# Patient Record
Sex: Male | Born: 1975 | Race: White | Hispanic: No | Marital: Married | Smoking: Never smoker
Health system: Southern US, Community
[De-identification: ages and names within clinical notes are randomized; demographics above are authoritative.]

## PROBLEM LIST (undated history)

## (undated) DIAGNOSIS — S42002A Fracture of unspecified part of left clavicle, initial encounter for closed fracture: Secondary | ICD-10-CM

## (undated) DIAGNOSIS — K219 Gastro-esophageal reflux disease without esophagitis: Secondary | ICD-10-CM

## (undated) HISTORY — PX: VASECTOMY: SHX75

## (undated) HISTORY — PX: COLONOSCOPY: SHX174

---

## 1998-06-07 ENCOUNTER — Emergency Department (HOSPITAL_COMMUNITY): Admission: EM | Admit: 1998-06-07 | Discharge: 1998-06-07 | Payer: Self-pay | Admitting: Emergency Medicine

## 1998-06-08 ENCOUNTER — Encounter: Payer: Self-pay | Admitting: Emergency Medicine

## 1999-06-29 ENCOUNTER — Ambulatory Visit (HOSPITAL_COMMUNITY): Admission: RE | Admit: 1999-06-29 | Discharge: 1999-06-29 | Payer: Self-pay | Admitting: Family Medicine

## 1999-06-29 ENCOUNTER — Encounter: Payer: Self-pay | Admitting: Family Medicine

## 1999-09-02 ENCOUNTER — Ambulatory Visit (HOSPITAL_COMMUNITY): Admission: RE | Admit: 1999-09-02 | Discharge: 1999-09-02 | Payer: Self-pay | Admitting: Family Medicine

## 1999-09-02 ENCOUNTER — Encounter: Payer: Self-pay | Admitting: Family Medicine

## 2011-08-10 ENCOUNTER — Encounter (HOSPITAL_BASED_OUTPATIENT_CLINIC_OR_DEPARTMENT_OTHER): Payer: Self-pay | Admitting: Student

## 2011-08-10 ENCOUNTER — Emergency Department (HOSPITAL_BASED_OUTPATIENT_CLINIC_OR_DEPARTMENT_OTHER)
Admission: EM | Admit: 2011-08-10 | Discharge: 2011-08-10 | Disposition: A | Discharge status: Home / Self Care | Payer: Federal, State, Local not specified - PPO | Attending: Emergency Medicine | Admitting: Emergency Medicine

## 2011-08-10 DIAGNOSIS — E860 Dehydration: Secondary | ICD-10-CM | POA: Insufficient documentation

## 2011-08-10 LAB — COMPREHENSIVE METABOLIC PANEL
ALT: 22 U/L (ref 0–53)
AST: 29 U/L (ref 0–37)
Albumin: 4.3 g/dL (ref 3.5–5.2)
Calcium: 9.4 mg/dL (ref 8.4–10.5)
Creatinine, Ser: 1.1 mg/dL (ref 0.50–1.35)
GFR calc non Af Amer: 86 mL/min — ABNORMAL LOW (ref >90)
Sodium: 136 mEq/L (ref 135–145)
Total Protein: 7.1 g/dL (ref 6.0–8.3)

## 2011-08-10 LAB — URINALYSIS, ROUTINE W REFLEX MICROSCOPIC
Bilirubin Urine: NEGATIVE
Glucose, UA: NEGATIVE mg/dL
Hgb urine dipstick: NEGATIVE
Ketones, ur: NEGATIVE mg/dL
Leukocytes, UA: NEGATIVE
Nitrite: NEGATIVE
Protein, ur: NEGATIVE mg/dL
Specific Gravity, Urine: 1.018 (ref 1.005–1.030)
Urobilinogen, UA: 0.2 mg/dL (ref 0.0–1.0)
pH: 7.5 (ref 5.0–8.0)

## 2011-08-10 LAB — CBC
HCT: 38.9 % — ABNORMAL LOW (ref 39.0–52.0)
MCHC: 35.5 g/dL (ref 30.0–36.0)
MCV: 85.1 fL (ref 78.0–100.0)
Platelets: 203 10*3/uL (ref 150–400)
RDW: 12.3 % (ref 11.5–15.5)
WBC: 6.5 10*3/uL (ref 4.0–10.5)

## 2011-08-10 LAB — DIFFERENTIAL
Basophils Absolute: 0 10*3/uL (ref 0.0–0.1)
Basophils Relative: 0 % (ref 0–1)
Eosinophils Absolute: 0.1 10*3/uL (ref 0.0–0.7)
Eosinophils Relative: 1 % (ref 0–5)
Monocytes Absolute: 0.5 10*3/uL (ref 0.1–1.0)

## 2011-08-10 MED ORDER — ONDANSETRON HCL 4 MG/2ML IJ SOLN
4.0000 mg | Freq: Once | INTRAMUSCULAR | Status: AC
  Administered 2011-08-10: 4 mg via INTRAVENOUS
  Filled 2011-08-10: qty 2

## 2011-08-10 MED ORDER — SODIUM CHLORIDE 0.9 % IV SOLN
Freq: Once | INTRAVENOUS | Status: AC
  Administered 2011-08-10: 15:00:00 via INTRAVENOUS

## 2011-08-10 NOTE — ED Notes (Signed)
MD at bedside. 

## 2011-08-10 NOTE — ED Provider Notes (Signed)
Medical screening examination/treatment/procedure(s) were performed by non-physician practitioner and as supervising physician I was immediately available for consultation/collaboration.   Lasundra Hascall A Chava Dulac, MD 08/10/11 2314 

## 2011-08-10 NOTE — ED Notes (Signed)
Pt in with c/o full body numbess and tingling and sore/swollen throat.

## 2011-08-10 NOTE — ED Provider Notes (Signed)
History     CSN: [id number]  Arrival date & time 08/10/11  1309   First MD Initiated Contact with Patient 08/10/11 1335      Chief Complaint  Patient presents with  . Sore Throat  . Numbness  . Tingling    (Consider location/radiation/quality/duration/timing/severity/associated sxs/prior treatment) Patient is a 36 y.o. male presenting with weakness. The history is provided by the patient. No language interpreter was used.  Weakness The primary symptoms include paresthesias and nausea. The symptoms began less than 1 hour ago. The symptoms are worsening.  Additional symptoms include weakness.   patient describes an episode of full-body numbness and tingling patient reports his throat felt sore and tight patient complains that he felt short of breath. 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 Patient reports he has been drinking plenty of fluids. He reports he is used to the heat in the exertion patient reports he normally drinks Gatorade but he did not drink the Gatorade today. Patient reports he does not have any medical problems he has never had anything happen like this in the past. He reports throat tightness shortness of breath and numbness have resolved. Patient reports still feeling a little tingling in his upper extremities at the time of my initial history. Patient denies any nausea vomiting or diarrhea. Patient has not had a fever he denies any cough he denies any visual changes hearing changes he denies any weakness in any extremities  History reviewed. No pertinent past medical history.  History reviewed. No pertinent past surgical history.  History reviewed. No pertinent family history.  History  Substance Use Topics  . Smoking status: Never Smoker   . Smokeless tobacco: Not on file  . Alcohol Use: No      Review of Systems  Gastrointestinal: Positive for nausea.  Neurological: Positive for weakness and  paresthesias.  All other systems reviewed and are negative.    Allergies  Review of patient's allergies indicates no known allergies.  Home Medications  No current outpatient prescriptions on file.  BP 131/75  Pulse 65  Temp 97.3 F (36.3 C) (Oral)  Resp 22  Wt 206 lb (93.441 kg)  SpO2 100%  Physical Exam  Nursing note and vitals reviewed. Constitutional: He is oriented to person, place, and time. He appears well-developed and well-nourished.  HENT:  Head: Normocephalic and atraumatic.  Right Ear: External ear normal.  Left Ear: External ear normal.  Nose: Nose normal.  Mouth/Throat: Oropharynx is clear and moist.  Eyes: Conjunctivae are normal. Pupils are equal, round, and reactive to light.  Neck: Normal range of motion. Neck supple.  Cardiovascular: Normal rate, regular rhythm and normal heart sounds.   Pulmonary/Chest: Effort normal and breath sounds normal.  Abdominal: Soft. Bowel sounds are normal.  Musculoskeletal: Normal range of motion.  Neurological: He is alert and oriented to person, place, and time. He has normal reflexes.  Skin: Skin is warm.  Psychiatric: He has a normal mood and affect.    ED Course  Procedures (including critical care time)  Labs Reviewed - No data to display No results found.   1. Dehydration    Results for orders placed during the hospital encounter of 08/10/11  URINALYSIS, ROUTINE W REFLEX MICROSCOPIC      Component Value Range   Color, Urine YELLOW  YELLOW   APPearance CLEAR  CLEAR   Specific Gravity, Urine 1.018  1.005 - 1.030  pH 7.5  5.0 - 8.0   Glucose, UA NEGATIVE  NEGATIVE mg/dL   Hgb urine dipstick NEGATIVE  NEGATIVE   Bilirubin Urine NEGATIVE  NEGATIVE   Ketones, ur NEGATIVE  NEGATIVE mg/dL   Protein, ur NEGATIVE  NEGATIVE mg/dL   Urobilinogen, UA 0.2  0.0 - 1.0 mg/dL   Nitrite NEGATIVE  NEGATIVE   Leukocytes, UA NEGATIVE  NEGATIVE  CBC      Component Value Range   WBC 6.5  4.0 - 10.5 K/uL   RBC 4.57   4.22 - 5.81 MIL/uL   Hemoglobin 13.8  13.0 - 17.0 g/dL   HCT 16.1 (*) 09.6 - 04.5 %   MCV 85.1  78.0 - 100.0 fL   MCH 30.2  26.0 - 34.0 pg   MCHC 35.5  30.0 - 36.0 g/dL   RDW 40.9  81.1 - 91.4 %   Platelets 203  150 - 400 K/uL  DIFFERENTIAL      Component Value Range   Neutrophils Relative 64  43 - 77 %   Neutro Abs 4.2  1.7 - 7.7 K/uL   Lymphocytes Relative 26  12 - 46 %   Lymphs Abs 1.7  0.7 - 4.0 K/uL   Monocytes Relative 8  3 - 12 %   Monocytes Absolute 0.5  0.1 - 1.0 K/uL   Eosinophils Relative 1  0 - 5 %   Eosinophils Absolute 0.1  0.0 - 0.7 K/uL   Basophils Relative 0  0 - 1 %   Basophils Absolute 0.0  0.0 - 0.1 K/uL  COMPREHENSIVE METABOLIC PANEL      Component Value Range   Sodium 136  135 - 145 mEq/L   Potassium 3.6  3.5 - 5.1 mEq/L   Chloride 101  96 - 112 mEq/L   CO2 24  19 - 32 mEq/L   Glucose, Bld 172 (*) 70 - 99 mg/dL   BUN 25 (*) 6 - 23 mg/dL   Creatinine, Ser 7.82  0.50 - 1.35 mg/dL   Calcium 9.4  8.4 - 95.6 mg/dL   Total Protein 7.1  6.0 - 8.3 g/dL   Albumin 4.3  3.5 - 5.2 g/dL   AST 29  0 - 37 U/L   ALT 22  0 - 53 U/L   Alkaline Phosphatase 59  39 - 117 U/L   Total Bilirubin 0.4  0.3 - 1.2 mg/dL   GFR calc non Af Amer 86 (*) >90 mL/min   GFR calc Af Amer >90  >90 mL/min   No results found.  Date: 08/10/2011  Rate: 70  Rhythm: normal sinus rhythm  QRS Axis: normal  Intervals: normal  ST/T Wave abnormalities: normal  Conduction Disutrbances:none  Narrative Interpretation:   Old EKG Reviewed: none available    MDM  Pt's bun is slightly elevated.   Pt given 2 liters IV fluids,   I advised continue oral fluids.   Pt advised rest, I doubt tia, stroke,   Symptoms do not sound neurologic,   EKg is normal.   I advised pt to return if any further symptoms.          Lonia Skinner [place], [place] 08/10/11 1954

## 2011-08-10 NOTE — ED Notes (Signed)
Iv infusing without difficulty spouse at the bedside

## 2011-08-10 NOTE — Discharge Instructions (Signed)
Dehydration, Adult Dehydration is when you lose more fluids from the body than you take in. Vital organs like the kidneys, brain, and heart cannot function without a proper amount of fluids and salt. Any loss of fluids from the body can cause dehydration.  CAUSES   Vomiting.   Diarrhea.   Excessive sweating.   Excessive urine output.   Fever.  SYMPTOMS  Mild dehydration  Thirst.   Dry lips.   Slightly dry mouth.  Moderate dehydration  Very dry mouth.   Sunken eyes.   Skin does not bounce back quickly when lightly pinched and released.   Dark urine and decreased urine production.   Decreased tear production.   Headache.  Severe dehydration  Very dry mouth.   Extreme thirst.   Rapid, weak pulse (more than 100 beats per minute at rest).   Cold hands and feet.   Not able to sweat in spite of heat and temperature.   Rapid breathing.   Blue lips.   Confusion and lethargy.   Difficulty being awakened.   Minimal urine production.   No tears.  DIAGNOSIS  Your caregiver will diagnose dehydration based on your symptoms and your exam. Blood and urine tests will help confirm the diagnosis. The diagnostic evaluation should also identify the cause of dehydration. TREATMENT  Treatment of mild or moderate dehydration can often be done at home by increasing the amount of fluids that you drink. It is best to drink small amounts of fluid more often. Drinking too much at one time can make vomiting worse. Refer to the home care instructions below. Severe dehydration needs to be treated at the hospital where you will probably be given intravenous (IV) fluids that contain water and electrolytes. HOME CARE INSTRUCTIONS   Ask your caregiver about specific rehydration instructions.   Drink enough fluids to keep your urine clear or pale yellow.   Drink small amounts frequently if you have nausea and vomiting.   Eat as you normally do.   Avoid:   Foods or drinks high in  sugar.   Carbonated drinks.   Juice.   Extremely hot or cold fluids.   Drinks with caffeine.   Fatty, greasy foods.   Alcohol.   Tobacco.   Overeating.   Gelatin desserts.   Wash your hands well to avoid spreading bacteria and viruses.   Only take over-the-counter or prescription medicines for pain, discomfort, or fever as directed by your caregiver.   Ask your caregiver if you should continue all prescribed and over-the-counter medicines.   Keep all follow-up appointments with your caregiver.  SEEK MEDICAL CARE IF:  You have abdominal pain and it increases or stays in one area (localizes).   You have a rash, stiff neck, or severe headache.   You are irritable, sleepy, or difficult to awaken.   You are weak, dizzy, or extremely thirsty.  SEEK IMMEDIATE MEDICAL CARE IF:   You are unable to keep fluids down or you get worse despite treatment.   You have frequent episodes of vomiting or diarrhea.   You have blood or green matter (bile) in your vomit.   You have blood in your stool or your stool looks black and tarry.   You have not urinated in 6 to 8 hours, or you have only urinated a small amount of very dark urine.   You have a fever.   You faint.  MAKE SURE YOU:   Understand these instructions.   Will watch your condition.     Will get help right away if you are not doing well or get worse.  Document Released: 02/13/2005 Document Revised: 02/02/2011 Document Reviewed: 10/03/2010 [employer] Patient Information 2012 [employer]. 

## 2011-08-12 ENCOUNTER — Emergency Department (HOSPITAL_COMMUNITY)
Admission: EM | Admit: 2011-08-12 | Discharge: 2011-08-12 | Disposition: A | Discharge status: Home / Self Care | Payer: Federal, State, Local not specified - PPO | Attending: Emergency Medicine | Admitting: Emergency Medicine

## 2011-08-12 ENCOUNTER — Encounter (HOSPITAL_COMMUNITY): Payer: Self-pay | Admitting: Emergency Medicine

## 2011-08-12 DIAGNOSIS — R202 Paresthesia of skin: Secondary | ICD-10-CM

## 2011-08-12 DIAGNOSIS — R209 Unspecified disturbances of skin sensation: Secondary | ICD-10-CM | POA: Insufficient documentation

## 2011-08-12 LAB — BASIC METABOLIC PANEL
CO2: 29 mEq/L (ref 19–32)
Chloride: 102 mEq/L (ref 96–112)
GFR calc non Af Amer: 68 mL/min — ABNORMAL LOW (ref >90)
Glucose, Bld: 96 mg/dL (ref 70–99)
Potassium: 4.2 mEq/L (ref 3.5–5.1)
Sodium: 139 mEq/L (ref 135–145)

## 2011-08-12 NOTE — ED Provider Notes (Signed)
History     CSN: [id number]  Arrival date & time 08/12/11  1628   First MD Initiated Contact with Patient 08/12/11 1945      Chief Complaint  Patient presents with  . Tingling    (Consider location/radiation/quality/duration/timing/severity/associated sxs/prior treatment) HPI  36 year old male past medical history of some right scapular musculoskeletal pain treated with Mobic and Flexeril recently, chief complaint 48 hour history of bilateral lower posterior leg and bilateral upper extremity hands>arms tingling sensation.  He has also intermittently seeing spots in his eyes. He felt lightheaded when all this started. He had trouble breathing when all this started for 10 minutes but that resolved. [facility], where he was diagnosed with dehydration and given 2 L of saline. He has felt poorly and fatigued since that day. He denies any weakness numbness nausea vomiting diarrhea or recent travel tick bite and all exposure chest pain cough changes in bowel or bladder habits.  On arrival his vital signs were normal  History reviewed. No pertinent past medical history.  History reviewed. No pertinent past surgical history.  History reviewed. No pertinent family history.  History  Substance Use Topics  . Smoking status: Never Smoker   . Smokeless tobacco: Not on file  . Alcohol Use: No      Review of Systems Constitutional: Negative for fever and chills.  HENT: Negative for ear pain, sore throat and trouble swallowing.   Eyes: Negative for pain and visual disturbance.  Respiratory: Negative for cough and shortness of breath.   Cardiovascular: Negative for chest pain and leg swelling.  Gastrointestinal: Negative for nausea, vomiting, abdominal pain and diarrhea.  Genitourinary: Negative for dysuria, urgency and frequency.  Musculoskeletal: Negative for back pain and joint swelling. Positive very mild right back pain located over the scapula.  Skin: Negative for  rash and wound.  Neurological: Negative for dizziness, syncope, speech difficulty, weakness and POS numbness.   Allergies  Review of patient's allergies indicates no known allergies.  Home Medications   Current Outpatient Rx  Name Route Sig Dispense Refill  . CYCLOBENZAPRINE HCL 5 MG PO TABS Oral Take 5 mg by mouth at bedtime as needed.    . MELOXICAM 15 MG PO TABS Oral Take 15 mg by mouth daily with breakfast.      BP 118/64  Pulse 65  Temp 98.4 F (36.9 C) (Oral)  Resp 18  SpO2 97%  Physical Exam Consitutional: Pt in no acute distress.   Head: Normocephalic and atraumatic.  Eyes: Extraocular motion intact, no scleral icterus Neck: Supple without meningismus, mass, or overt JVD Respiratory: Effort normal and breath sounds normal. No respiratory distress. CV: Heart regular rate and rhythm, no obvious murmurs.  Pulses +2 and symmetric Abdomen: Soft, non-tender, non-distended MSK: Extremities are atraumatic without deformity, ROM intact. Entire length of spine nontender to coarse percussion Skin: Warm, dry, intact Neuro: Alert and oriented, no motor deficit noted.  CNs intact.  PERRL.  Reflexes normal BLE, no clonus.  Hips, knee and ankle, arm and wrist strength maintained.  FTN and RAM normal.  CNs 2-12 normal.  Psychiatric: Mood and affect are normal    ED Course  Procedures (including critical care time)  Labs Reviewed  BASIC METABOLIC PANEL - Abnormal; Notable for the following:    GFR calc non Af Amer 68 (*)     GFR calc Af Amer 79 (*)     All other components within normal limits   No results found.  1. Paresthesia of right arm and leg   2. Paresthesia of left arm and leg       MDM  Unclear etiology. Patient with normal vital signs. Patient looks healthy. Negative neurological exam. We'll check electrolytes for calcium dysregulation. We'll do orthostatic vital signs.    Orthostatics negative.  No evidence of calcium dysregulation. Discharged home to  followup with neurologist if not better in 3 days.  PT DC home stable.  Discussed with pt the clinical impression, treatment in the ED, and follow up plan.  We alslo discussed the indications for returning to the ED, which include shortness or breath, confusion, fever, new weakness or numbness, chest pain, or any other concerning symptom.  The pt understood the treatment and plan, is stable, and is able to leave the ED.           Larrie Kass, MD 08/12/11 2317

## 2011-08-12 NOTE — ED Notes (Signed)
Patient states that he has had tingling all over off and on for the past couple of days.  Was seen at [facility] and given 2 liters of fluids and stated that he started feeling better.   This AM the tingling was constant, he called off both jobs and laid around at home. He states he has not gotten better and decided to come to be seen.  Denies headache, grips are equal.

## 2011-08-12 NOTE — ED Notes (Signed)
Pt reports that he has been having neck stiffness and tingling in bil arms, reports pulled muscle in mid back 5/18, and week after that is when started to have tingling, has gotten progressively worse; pt was seen 6/13- was told he was dehydrated and received fluid- reports, that tingling felt better, but has returned again; pt reports all symptoms tingling in extremities, face tingling, neck stiff, general malaise, stiffness with movemnet, vision darkening, fatigue; grips equal; face symmetrical, no arm drift

## 2011-08-12 NOTE — Discharge Instructions (Signed)
Follow up with your providers as dicussed in the ED today and as written above.  See your doctor immediately--or return to the ED--with any new or troubling symptoms including fevers, weakness, new chest pain, shortness or breath, numbness, or any other concerning symptom.    Paresthesia    Paresthesia is an abnormal burning or prickling sensation. This sensation is generally felt in the hands, arms, legs, or feet. However, it may occur in any part of the body. It is usually not painful. The feeling may be described as:  Tingling or numbness.   "Pins and needles."   Skin crawling.   Buzzing.   Limbs "falling asleep."   Itching.  Most people experience temporary (transient) paresthesia at some time in their lives. CAUSES  Paresthesia may occur when you breathe too quickly (hyperventilation). It can also occur without any apparent cause. Commonly, paresthesia occurs when pressure is placed on a nerve. The feeling quickly goes away once the pressure is removed. For some people, however, paresthesia is a long-lasting (chronic) condition caused by an underlying disorder. The underlying disorder may be:  A traumatic, direct injury to nerves. Examples include a:   Broken (fractured) neck.   Fractured skull.   A disorder affecting the brain and spinal cord (central nervous system). Examples include:   Transverse myelitis.   Encephalitis.   Transient ischemic attack.   Multiple sclerosis.   Stroke.   Tumor or blood vessel problems, such as an arteriovenous malformation pressing against the brain or spinal cord.   A condition that damages the peripheral nerves (peripheral neuropathy). Peripheral nerves are not part of the brain and spinal cord. These conditions include:   Diabetes.   Peripheral vascular disease.   Nerve entrapment syndromes, such as carpal tunnel syndrome.   Shingles.   Hypothyroidism.   Vitamin B12 deficiencies.   Alcoholism.   Heavy metal poisoning  (lead, arsenic).   Rheumatoid arthritis.   Systemic lupus erythematosus.  DIAGNOSIS  Your caregiver will attempt to find the underlying cause of your paresthesia. Your caregiver may:  Take your medical history.   Perform a physical exam.   Order various lab tests.   Order imaging tests.  TREATMENT  Treatment for paresthesia depends on the underlying cause. HOME CARE INSTRUCTIONS  Avoid drinking alcohol.   You may consider massage or acupuncture to help relieve your symptoms.   Keep all follow-up appointments as directed by your caregiver.  SEEK IMMEDIATE MEDICAL CARE IF:   You feel weak.   You have trouble walking or moving.   You have problems with speech or vision.   You feel confused.   You cannot control your bladder or bowel movements.   You feel numbness after an injury.   You faint.   Your burning or prickling feeling gets worse when walking.   You have pain, cramps, or dizziness.   You develop a rash.  MAKE SURE YOU:  Understand these instructions.   Will watch your condition.   Will get help right away if you are not doing well or get worse.  Document Released: 02/03/2002 Document Revised: 02/02/2011 Document Reviewed: 11/04/2010 [facility] Patient Information 2012 [place], [place].

## 2011-08-13 NOTE — ED Provider Notes (Signed)
  I performed a history and physical examination of Alexander Burgess and discussed his management with Dr. Rainey Pines.  I agree with the history, physical, assessment, and plan of care, with the following exceptions: None Generally well-appearing young male now subjective dysesthesia, neurologically intact.  Given the patient's description of appropriate and lower extremity dysesthesias or some suspicion for inflammatory condition.  The patient was discharged in stable condition after discussion on the need for continued management via his primary care physician and an unremarkable evaluation here  Eladio Dentremont, Elvis Coil, MD 08/13/11 0008

## 2011-08-21 ENCOUNTER — Encounter (HOSPITAL_BASED_OUTPATIENT_CLINIC_OR_DEPARTMENT_OTHER): Payer: Self-pay | Admitting: *Deleted

## 2011-08-21 ENCOUNTER — Emergency Department (HOSPITAL_BASED_OUTPATIENT_CLINIC_OR_DEPARTMENT_OTHER)
Admission: EM | Admit: 2011-08-21 | Discharge: 2011-08-21 | Disposition: A | Discharge status: Home / Self Care | Payer: Federal, State, Local not specified - PPO | Attending: Emergency Medicine | Admitting: Emergency Medicine

## 2011-08-21 ENCOUNTER — Emergency Department (HOSPITAL_BASED_OUTPATIENT_CLINIC_OR_DEPARTMENT_OTHER): Payer: Federal, State, Local not specified - PPO

## 2011-08-21 DIAGNOSIS — Y93G1 Activity, food preparation and clean up: Secondary | ICD-10-CM | POA: Insufficient documentation

## 2011-08-21 DIAGNOSIS — W260XXA Contact with knife, initial encounter: Secondary | ICD-10-CM | POA: Insufficient documentation

## 2011-08-21 DIAGNOSIS — W261XXA Contact with sword or dagger, initial encounter: Secondary | ICD-10-CM | POA: Insufficient documentation

## 2011-08-21 DIAGNOSIS — Z23 Encounter for immunization: Secondary | ICD-10-CM | POA: Insufficient documentation

## 2011-08-21 DIAGNOSIS — S61218A Laceration without foreign body of other finger without damage to nail, initial encounter: Secondary | ICD-10-CM

## 2011-08-21 DIAGNOSIS — S61209A Unspecified open wound of unspecified finger without damage to nail, initial encounter: Secondary | ICD-10-CM | POA: Insufficient documentation

## 2011-08-21 MED ORDER — TETANUS-DIPHTH-ACELL PERTUSSIS 5-2.5-18.5 LF-MCG/0.5 IM SUSP
0.5000 mL | Freq: Once | INTRAMUSCULAR | Status: AC
  Administered 2011-08-21: 0.5 mL via INTRAMUSCULAR
  Filled 2011-08-21: qty 0.5

## 2011-08-21 NOTE — ED Provider Notes (Signed)
History     CSN: [id number]  Arrival date & time 08/21/11  2104   First MD Initiated Contact with Patient 08/21/11 2256      Chief Complaint  Patient presents with  . Extremity Laceration    (Consider location/radiation/quality/duration/timing/severity/associated sxs/prior treatment) The history is provided by the patient.   patient cut the tip of his left index finger with a knife while cutting onions. No other injury. His been unable to stop the bleeding. No numbness or weakness. Last tetanus was greater than 10 years ago. He works as a Advertising account planner.  History reviewed. No pertinent past medical history.  History reviewed. No pertinent past surgical history.  History reviewed. No pertinent family history.  History  Substance Use Topics  . Smoking status: Never Smoker   . Smokeless tobacco: Not on file  . Alcohol Use: No     social      Review of Systems  Musculoskeletal: Negative for joint swelling.  Skin: Positive for wound.  Neurological: Negative for weakness and numbness.    Allergies  Review of patient's allergies indicates no known allergies.  Home Medications   Current Outpatient Rx  Name Route Sig Dispense Refill  . CYCLOBENZAPRINE HCL 5 MG PO TABS Oral Take 5 mg by mouth at bedtime as needed.    . MELOXICAM 15 MG PO TABS Oral Take 15 mg by mouth daily with breakfast.      BP 140/64  Pulse 69  Temp 97.9 F (36.6 C) (Oral)  Resp 19  SpO2 99%  Physical Exam  Constitutional: He appears well-developed.  Musculoskeletal: Normal range of motion.       Approximately 1 cm oval-shaped amputation of tissue on the pad of the left index finger. Flexion extension is intact at MCP PIP and DIP joints. Mild bleeding. No pulsatile bleeding. Nailbed is uninvolved.  Neurological: He is alert.    ED Course  Procedures (including critical care time)  Labs Reviewed - No data to display Dg Finger Index Left  08/21/2011  *RADIOLOGY REPORT*  Clinical Data: Laceration   LEFT INDEX FINGER 2+V  Comparison: None.  Findings: Soft tissue injury noted distally.  No radiopaque foreign body.  No underlying fracture or acute osseous finding.  No malalignment.  IMPRESSION: Soft tissue injury.  No acute osseous finding  Original Report Authenticated By: Judie Petit. TREVOR Miles Costain, M.D.     1. Laceration of finger, index       MDM  Laceration to finger with some amputation of tissue. Tetanus is updated. X-ray shows no fracture. Is not a suturable laceration. Patient was given nonocclusive dressings and will followup as needed.        Juliet Rude. Rubin Payor, MD 08/21/11 2314

## 2011-08-21 NOTE — ED Notes (Signed)
MD at bedside. 

## 2011-08-21 NOTE — ED Notes (Signed)
Pt was cutting onions with a knife and cut his left index finger.  Pt was unable to stop bleeding.

## 2011-08-21 NOTE — Discharge Instructions (Signed)
Finger Avulsion  When the tip of the finger is lost, a new nail may grow back if part of the fingernail is left. The new nail may be deformed. If just the tip of the finger is lost, no repair may be needed unless there is bone showing. If bone is showing, your caregiver may need to remove the protruding bone and put on a bandage. Your caregiver will do what is best for you. Most of the time when a fingertip is lost, the end will gradually grow back on and look fairly normal, but it may remain sensitive to pressure and temperature extremes for a long time. HOME CARE INSTRUCTIONS   Keep your hand elevated above your heart to relieve pain and swelling.   Keep your dressing dry and clean.   Change your bandage in 24 hours or as directed.   After your bandage is changed, soak your hand in warm soapy water for 10 to 15 minutes. Do this 3 times per day. This helps reduce pain and swelling.   After soaking your hand, apply a clean, dry bandage. Change your bandage if it is wet or dirty.   Only take over-the-counter or prescription medicines for pain, discomfort, or fever as directed by your caregiver.   See your caregiver as needed for problems.  SEEK MEDICAL CARE IF:   You have increased pain, swelling, drainage, or bleeding.   You have a fever.   You have swelling that spreads from your finger and into your hand.  Make sure to check to see if you need a tetanus booster. Document Released: 04/24/2001 Document Revised: 02/02/2011 Document Reviewed: 03/19/2008 [facility] Patient Information 2012 [place], [place].

## 2016-04-16 ENCOUNTER — Emergency Department (HOSPITAL_COMMUNITY): Payer: Federal, State, Local not specified - PPO

## 2016-04-16 ENCOUNTER — Encounter (HOSPITAL_COMMUNITY): Payer: Self-pay | Admitting: *Deleted

## 2016-04-16 ENCOUNTER — Emergency Department (HOSPITAL_COMMUNITY)
Admission: EM | Admit: 2016-04-16 | Discharge: 2016-04-16 | Disposition: A | Discharge status: Home / Self Care | Payer: Federal, State, Local not specified - PPO | Attending: Emergency Medicine | Admitting: Emergency Medicine

## 2016-04-16 DIAGNOSIS — Y92320 Baseball field as the place of occurrence of the external cause: Secondary | ICD-10-CM | POA: Diagnosis not present

## 2016-04-16 DIAGNOSIS — Y9364 Activity, baseball: Secondary | ICD-10-CM | POA: Insufficient documentation

## 2016-04-16 DIAGNOSIS — S4992XA Unspecified injury of left shoulder and upper arm, initial encounter: Secondary | ICD-10-CM | POA: Diagnosis present

## 2016-04-16 DIAGNOSIS — Z79899 Other long term (current) drug therapy: Secondary | ICD-10-CM | POA: Insufficient documentation

## 2016-04-16 DIAGNOSIS — S42002A Fracture of unspecified part of left clavicle, initial encounter for closed fracture: Secondary | ICD-10-CM | POA: Diagnosis not present

## 2016-04-16 DIAGNOSIS — W010XXA Fall on same level from slipping, tripping and stumbling without subsequent striking against object, initial encounter: Secondary | ICD-10-CM | POA: Insufficient documentation

## 2016-04-16 DIAGNOSIS — Y999 Unspecified external cause status: Secondary | ICD-10-CM | POA: Diagnosis not present

## 2016-04-16 MED ORDER — DICLOFENAC SODIUM 50 MG PO TBEC: 50.0000 mg | DELAYED_RELEASE_TABLET | Freq: Two times a day (BID) | ORAL | 0 refills | Status: DC

## 2016-04-16 MED ORDER — OXYCODONE-ACETAMINOPHEN 5-325 MG PO TABS
1.0000 | ORAL_TABLET | ORAL | 0 refills | Status: DC | PRN
Start: 2016-04-16 — End: 2016-04-18

## 2016-04-16 MED ORDER — OXYCODONE-ACETAMINOPHEN 5-325 MG PO TABS
2.0000 | ORAL_TABLET | Freq: Once | ORAL | Status: AC
  Administered 2016-04-16: 2 via ORAL
  Filled 2016-04-16: qty 2

## 2016-04-16 NOTE — Discharge Instructions (Signed)
Call the office to schedule a time to come in on Wednesday. If you have problems before then, return here. Do not drive while taking the narcotic as it will make you sleepy.

## 2016-04-16 NOTE — ED Notes (Signed)
Pt to radiology.

## 2016-04-16 NOTE — ED Triage Notes (Signed)
Pt reports falling while playing baseball with kids. Pt landed on left shoulder. Has pain shoulder/clavicle and possible dislocation.

## 2016-04-16 NOTE — Progress Notes (Signed)
Orthopedic Tech Progress Note Patient Details:  Alexander CaseyGreg Burgess 12/11/1975 [id number]  Ortho Devices Type of Ortho Device: Arm sling Ortho Device/Splint Location: LUE Ortho Device/Splint Interventions: Ordered, Application   Jennye MoccasinHughes, [name] 04/16/2016, 9:03 PM

## 2016-04-16 NOTE — ED Notes (Signed)
Pt states he was playing baseball with some kids at a park when he was running and tripped, thus landing on his left shoulder. Pt states he was trying to avoid landing on a child.

## 2016-04-16 NOTE — ED Provider Notes (Signed)
[facility]-EMERGENCY DEPT Provider Note    By signing my name below, I, Earmon Phoenix, attest that this documentation has been prepared under the direction and in the presence of [facility], [place]. Electronically Signed: Earmon Phoenix, ED Scribe. 04/16/16. 9:18 PM.    History   Chief Complaint Chief Complaint  Patient presents with  . Shoulder Pain    The history is provided by the patient and medical records. No language interpreter was used.    Alexander Burgess is a 41 y.o. male who presents to the Emergency Department complaining of left shoulder pain that began approximately 2.5 hours ago. He states he was playing baseball with his children and fell onto the left shoulder. He reports associated moderate to severe pain. He currently rates the pain at 7/10. He has not taken anything for pain relief. Raising the LUE increases the pain. He denies alleviating factors. He denies numbness, tingling or weakness of the LUE, head trauma, LOC, bruising, wounds. He states he has fractured both clavicles in the past "several times".   History reviewed. No pertinent past medical history.  There are no active problems to display for this patient.   History reviewed. No pertinent surgical history.     Home Medications    Prior to Admission medications   Medication Sig Start Date End Date Taking? Authorizing Provider  cyclobenzaprine (FLEXERIL) 5 MG tablet Take 5 mg by mouth at bedtime as needed.    Historical Provider, MD  diclofenac (VOLTAREN) 50 MG EC tablet Take 1 tablet (50 mg total) by mouth 2 (two) times daily. 04/16/16   Damico Partin Orlene Och, NP  meloxicam (MOBIC) 15 MG tablet Take 15 mg by mouth daily with breakfast.    Historical Provider, MD  oxyCODONE-acetaminophen (PERCOCET/ROXICET) 5-325 MG tablet Take 1-2 tablets by mouth every 4 (four) hours as needed for severe pain. 04/16/16   Aydyn Testerman Orlene Och, NP    Family History History reviewed. No pertinent family history.  Social History Social  History  Substance Use Topics  . Smoking status: Never Smoker  . Smokeless tobacco: Not on file  . Alcohol use No     Comment: social     Allergies   Patient has no known allergies.   Review of Systems Review of Systems  Constitutional: Negative for fever.  HENT: Negative for facial swelling and sinus pressure.   Respiratory: Negative for shortness of breath.   Cardiovascular: Negative for chest pain.  Gastrointestinal: Negative for abdominal pain, nausea and vomiting.  Musculoskeletal: Positive for arthralgias and myalgias. Negative for back pain, gait problem and neck pain.  Skin: Negative for wound.  Neurological: Negative for light-headedness and headaches.  Psychiatric/Behavioral: Negative for confusion.     Physical Exam Updated Vital Signs BP 156/89 (BP Location: Right Arm)   Pulse 81   Temp 99.6 F (37.6 C) (Oral)   Resp 18   SpO2 99%   Physical Exam  Constitutional: He is oriented to person, place, and time. He appears well-developed and well-nourished.  HENT:  Head: Normocephalic.  Eyes: EOM are normal.  Neck: Normal range of motion. Neck supple. No spinous process tenderness and no muscular tenderness present. Normal range of motion present.  Cardiovascular: Normal rate and regular rhythm.   No murmur heard. Radial pulses 2+ bilaterally. Adequate circulation.  Pulmonary/Chest: Effort normal and breath sounds normal.  Abdominal: Soft. There is no tenderness.  Musculoskeletal: He exhibits tenderness and deformity. He exhibits no edema.  No tenderness over cervical spine. No tenderness of right  clavicle. Full ROM of right wrist, elbow and shoulder. Deformity and tenderness to left clavicle. Left wrist and elbow normal with full ROM. No signs of compartment syndrome. Radial pulses 2+, adequate circulation, equal grips.   Neurological: He is alert and oriented to person, place, and time. No cranial nerve deficit.  Skin: Skin is warm and dry.  Psychiatric: He  has a normal mood and affect. His behavior is normal.  Nursing note and vitals reviewed.    ED Treatments / Results  DIAGNOSTIC STUDIES: Oxygen Saturation is 99% on RA, normal by my interpretation.   COORDINATION OF CARE: Discussed this case with Dr. Rosalia Hammers, my attending, and reviewed x-rays.  7:54 PM- Will consult orthopedist. Pt verbalizes understanding and agrees to plan.  8:30 PM- Spoke with Dr. August Saucer who advises to immobilize with a sling and provide pain control. Will see pt in his office this week.   Medications  oxyCODONE-acetaminophen (PERCOCET/ROXICET) 5-325 MG per tablet 2 tablet (2 tablets Oral Given 04/16/16 1945)     Radiology Dg Clavicle Left  Result Date: 04/16/2016 CLINICAL DATA:  Status post fall onto left shoulder while playing baseball, with left clavicular pain. Initial encounter. EXAM: LEFT CLAVICLE - 2+ VIEWS COMPARISON:  None. FINDINGS: There is a mildly comminuted fracture of the middle third of the left clavicle, with approximately 1 shaft width inferior displacement of the distal clavicle. No additional fractures are seen. The left acromioclavicular joint is unremarkable. The left humeral head remains seated at the glenoid fossa. Mild soft tissue swelling is noted at the site of fracture. The visualized portions of lungs are grossly clear. IMPRESSION: Mildly comminuted fracture of the middle third of the left clavicle, with approximately 1 shaft width inferior displacement of the distal clavicle. Electronically Signed   By: Roanna Raider M.D.   On: 04/16/2016 19:35   Dg Shoulder Left  Result Date: 04/16/2016 CLINICAL DATA:  Status post fall onto left shoulder, with left shoulder pain. Initial encounter. EXAM: LEFT SHOULDER - 2+ VIEW COMPARISON:  None. FINDINGS: There is a mildly comminuted fracture at the middle third of the left clavicle, with approximately 1 shaft width inferior displacement of the distal clavicle. No additional fractures are seen. The left  humeral head is seated within the glenoid fossa. The acromioclavicular joint is unremarkable in appearance. No significant soft tissue abnormalities are seen. The visualized portions of the left lung are clear. IMPRESSION: Mildly comminuted fracture of the middle third of the left clavicle, with approximately 1 shaft width inferior displacement of the distal clavicle. Electronically Signed   By: Roanna Raider M.D.   On: 04/16/2016 19:34    Procedures Procedures (including critical care time)  Medications Ordered in ED Medications  oxyCODONE-acetaminophen (PERCOCET/ROXICET) 5-325 MG per tablet 2 tablet (2 tablets Oral Given 04/16/16 1945)     Initial Impression / Assessment and Plan / ED Course  I have reviewed the triage vital signs and the nursing notes.   *Patient presenting with an injury to the left clavicle that occurred about 2.5 hours ago while playing baseball with his children. He states he fell directly on the left shoulder. X-Ray shows mildly comminuted fracture of the middle third of the left clavicle, with approximately 1 shaft width inferior displacement of the distal clavicle. Pt advised to follow up with orthopedics. Patient given arm sling while in ED, conservative therapy recommended and discussed. Patient will be discharged home & is agreeable with above plan. Return precautions discussed. Pt appears safe for discharge.  I  personally performed the services described in this documentation, which was scribed in my presence. The recorded information has been reviewed and is accurate.   Final Clinical Impressions(s) / ED Diagnoses   Final diagnoses:  Closed displaced fracture of left clavicle, unspecified part of clavicle, initial encounter    New Prescriptions Discharge Medication List as of 04/16/2016  8:51 PM    START taking these medications   Details  diclofenac (VOLTAREN) 50 MG EC tablet Take 1 tablet (50 mg total) by mouth 2 (two) times daily., Starting Sun  04/16/2016, Print    oxyCODONE-acetaminophen (PERCOCET/ROXICET) 5-325 MG tablet Take 1-2 tablets by mouth every 4 (four) hours as needed for severe pain., Starting Sun 04/16/2016, [address]Print         Novalyn Lajara LongfellowM Therron Sells, NP 04/17/16 [id number]    Margarita Grizzleanielle Ray, MD 04/24/16 [phone]

## 2016-04-17 ENCOUNTER — Encounter (HOSPITAL_BASED_OUTPATIENT_CLINIC_OR_DEPARTMENT_OTHER): Payer: Self-pay | Admitting: *Deleted

## 2016-04-17 ENCOUNTER — Other Ambulatory Visit: Payer: Self-pay | Admitting: Orthopedic Surgery

## 2016-04-18 ENCOUNTER — Ambulatory Visit (HOSPITAL_BASED_OUTPATIENT_CLINIC_OR_DEPARTMENT_OTHER)
Admission: RE | Admit: 2016-04-18 | Discharge: 2016-04-18 | Disposition: A | Discharge status: Home / Self Care | Payer: Federal, State, Local not specified - PPO | Source: Ambulatory Visit | Attending: Orthopedic Surgery | Admitting: Orthopedic Surgery

## 2016-04-18 ENCOUNTER — Ambulatory Visit (HOSPITAL_BASED_OUTPATIENT_CLINIC_OR_DEPARTMENT_OTHER): Payer: Federal, State, Local not specified - PPO | Admitting: Anesthesiology

## 2016-04-18 ENCOUNTER — Encounter (HOSPITAL_BASED_OUTPATIENT_CLINIC_OR_DEPARTMENT_OTHER): Payer: Self-pay

## 2016-04-18 ENCOUNTER — Encounter (HOSPITAL_BASED_OUTPATIENT_CLINIC_OR_DEPARTMENT_OTHER)
Admission: RE | Disposition: A | Discharge status: Home / Self Care | Payer: Self-pay | Source: Ambulatory Visit | Attending: Orthopedic Surgery

## 2016-04-18 DIAGNOSIS — K219 Gastro-esophageal reflux disease without esophagitis: Secondary | ICD-10-CM | POA: Insufficient documentation

## 2016-04-18 DIAGNOSIS — Y9364 Activity, baseball: Secondary | ICD-10-CM | POA: Insufficient documentation

## 2016-04-18 DIAGNOSIS — S42002A Fracture of unspecified part of left clavicle, initial encounter for closed fracture: Secondary | ICD-10-CM | POA: Diagnosis not present

## 2016-04-18 DIAGNOSIS — W010XXA Fall on same level from slipping, tripping and stumbling without subsequent striking against object, initial encounter: Secondary | ICD-10-CM | POA: Diagnosis not present

## 2016-04-18 DIAGNOSIS — Y92830 Public park as the place of occurrence of the external cause: Secondary | ICD-10-CM | POA: Diagnosis not present

## 2016-04-18 DIAGNOSIS — Y998 Other external cause status: Secondary | ICD-10-CM | POA: Insufficient documentation

## 2016-04-18 HISTORY — DX: Gastro-esophageal reflux disease without esophagitis: K21.9

## 2016-04-18 HISTORY — DX: Fracture of unspecified part of left clavicle, initial encounter for closed fracture: S42.002A

## 2016-04-18 HISTORY — PX: ORIF CLAVICULAR FRACTURE: SHX5055

## 2016-04-18 SURGERY — OPEN REDUCTION INTERNAL FIXATION (ORIF) CLAVICULAR FRACTURE
Anesthesia: General | Site: Shoulder | Laterality: Left

## 2016-04-18 MED ORDER — SCOPOLAMINE 1 MG/3DAYS TD PT72: 1.0000 | MEDICATED_PATCH | Freq: Once | TRANSDERMAL | Status: DC | PRN

## 2016-04-18 MED ORDER — HYDROMORPHONE HCL 1 MG/ML IJ SOLN
INTRAMUSCULAR | Status: AC
  Filled 2016-04-18: qty 1

## 2016-04-18 MED ORDER — ONDANSETRON HCL 4 MG/2ML IJ SOLN
INTRAMUSCULAR | Status: DC | PRN
Start: 2016-04-18 — End: 2016-04-18
  Administered 2016-04-18: 4 mg via INTRAVENOUS

## 2016-04-18 MED ORDER — LIDOCAINE HCL 4 % EX SOLN
CUTANEOUS | Status: DC | PRN
  Administered 2016-04-18: 3 mL via TOPICAL

## 2016-04-18 MED ORDER — LIDOCAINE HCL (CARDIAC) 20 MG/ML IV SOLN
INTRAVENOUS | Status: DC | PRN
  Administered 2016-04-18: 60 mg via INTRAVENOUS

## 2016-04-18 MED ORDER — LACTATED RINGERS IV SOLN
INTRAVENOUS | Status: DC
  Administered 2016-04-18 (×2): via INTRAVENOUS

## 2016-04-18 MED ORDER — CEFAZOLIN SODIUM-DEXTROSE 2-4 GM/100ML-% IV SOLN
2.0000 g | INTRAVENOUS | Status: AC
  Administered 2016-04-18: 2 g via INTRAVENOUS

## 2016-04-18 MED ORDER — MIDAZOLAM HCL 2 MG/2ML IJ SOLN
INTRAMUSCULAR | Status: AC
  Filled 2016-04-18: qty 2

## 2016-04-18 MED ORDER — DEXAMETHASONE SODIUM PHOSPHATE 10 MG/ML IJ SOLN
INTRAMUSCULAR | Status: AC
  Filled 2016-04-18: qty 1

## 2016-04-18 MED ORDER — ONDANSETRON HCL 4 MG PO TABS: 4.0000 mg | ORAL_TABLET | Freq: Three times a day (TID) | ORAL | 0 refills | Status: AC | PRN

## 2016-04-18 MED ORDER — PROPOFOL 10 MG/ML IV BOLUS
INTRAVENOUS | Status: AC
  Filled 2016-04-18: qty 20

## 2016-04-18 MED ORDER — BUPIVACAINE HCL (PF) 0.25 % IJ SOLN
INTRAMUSCULAR | Status: AC
  Filled 2016-04-18: qty 30

## 2016-04-18 MED ORDER — HYDROMORPHONE HCL 1 MG/ML IJ SOLN
0.2500 mg | INTRAMUSCULAR | Status: DC | PRN
  Administered 2016-04-18 (×2): 0.5 mg via INTRAVENOUS

## 2016-04-18 MED ORDER — OXYCODONE-ACETAMINOPHEN 10-325 MG PO TABS: 1.0000 | ORAL_TABLET | Freq: Four times a day (QID) | ORAL | 0 refills | Status: AC | PRN

## 2016-04-18 MED ORDER — FENTANYL CITRATE (PF) 100 MCG/2ML IJ SOLN
50.0000 ug | INTRAMUSCULAR | Status: AC | PRN
  Administered 2016-04-18 (×3): 50 ug via INTRAVENOUS
  Administered 2016-04-18: 100 ug via INTRAVENOUS
  Administered 2016-04-18 (×3): 50 ug via INTRAVENOUS

## 2016-04-18 MED ORDER — FENTANYL CITRATE (PF) 100 MCG/2ML IJ SOLN
INTRAMUSCULAR | Status: AC
  Filled 2016-04-18: qty 4

## 2016-04-18 MED ORDER — BUPIVACAINE HCL (PF) 0.5 % IJ SOLN
INTRAMUSCULAR | Status: AC
  Filled 2016-04-18: qty 30

## 2016-04-18 MED ORDER — PROPOFOL 10 MG/ML IV BOLUS
INTRAVENOUS | Status: DC | PRN
  Administered 2016-04-18: 150 mg via INTRAVENOUS
  Administered 2016-04-18: 50 mg via INTRAVENOUS

## 2016-04-18 MED ORDER — LIDOCAINE 2% (20 MG/ML) 5 ML SYRINGE
INTRAMUSCULAR | Status: AC
  Filled 2016-04-18: qty 5

## 2016-04-18 MED ORDER — KETOROLAC TROMETHAMINE 30 MG/ML IJ SOLN
30.0000 mg | Freq: Once | INTRAMUSCULAR | Status: AC
  Administered 2016-04-18: 30 mg via INTRAVENOUS

## 2016-04-18 MED ORDER — ACETAMINOPHEN 10 MG/ML IV SOLN
1000.0000 mg | Freq: Once | INTRAVENOUS | Status: AC
  Administered 2016-04-18: 1000 mg via INTRAVENOUS

## 2016-04-18 MED ORDER — MEPERIDINE HCL 25 MG/ML IJ SOLN: 6.2500 mg | INTRAMUSCULAR | Status: DC | PRN

## 2016-04-18 MED ORDER — SENNA-DOCUSATE SODIUM 8.6-50 MG PO TABS: 2.0000 | ORAL_TABLET | Freq: Every day | ORAL | 1 refills | Status: AC

## 2016-04-18 MED ORDER — PROMETHAZINE HCL 25 MG/ML IJ SOLN: 6.2500 mg | INTRAMUSCULAR | Status: DC | PRN

## 2016-04-18 MED ORDER — FENTANYL CITRATE (PF) 100 MCG/2ML IJ SOLN
INTRAMUSCULAR | Status: AC
  Filled 2016-04-18: qty 2

## 2016-04-18 MED ORDER — CEFAZOLIN SODIUM-DEXTROSE 2-4 GM/100ML-% IV SOLN
INTRAVENOUS | Status: AC
  Filled 2016-04-18: qty 100

## 2016-04-18 MED ORDER — ACETAMINOPHEN 10 MG/ML IV SOLN
INTRAVENOUS | Status: AC
  Filled 2016-04-18: qty 100

## 2016-04-18 MED ORDER — LACTATED RINGERS IV SOLN: INTRAVENOUS | Status: DC

## 2016-04-18 MED ORDER — ONDANSETRON HCL 4 MG/2ML IJ SOLN
INTRAMUSCULAR | Status: AC
  Filled 2016-04-18: qty 2

## 2016-04-18 MED ORDER — BACLOFEN 10 MG PO TABS: 10.0000 mg | ORAL_TABLET | Freq: Three times a day (TID) | ORAL | 0 refills | Status: AC

## 2016-04-18 MED ORDER — KETOROLAC TROMETHAMINE 30 MG/ML IJ SOLN
INTRAMUSCULAR | Status: AC
  Filled 2016-04-18: qty 1

## 2016-04-18 MED ORDER — DEXAMETHASONE SODIUM PHOSPHATE 4 MG/ML IJ SOLN
INTRAMUSCULAR | Status: DC | PRN
  Administered 2016-04-18: 10 mg via INTRAVENOUS

## 2016-04-18 MED ORDER — MIDAZOLAM HCL 2 MG/2ML IJ SOLN
1.0000 mg | INTRAMUSCULAR | Status: DC | PRN
  Administered 2016-04-18: 2 mg via INTRAVENOUS

## 2016-04-18 MED ORDER — BUPIVACAINE HCL (PF) 0.5 % IJ SOLN
INTRAMUSCULAR | Status: DC | PRN
  Administered 2016-04-18: 20 mL

## 2016-04-18 SURGICAL SUPPLY — 72 items
BIT DRILL 2.3 QUICK RELEASE (BIT) IMPLANT
BIT DRILL 2.8X5 QR DISP (BIT) ×3 IMPLANT
BIT DRILL QUICK RELEASE 2.0MM (INSTRUMENTS) ×1 IMPLANT
BLADE HEX COATED 2.75 (ELECTRODE) ×3 IMPLANT
BLADE SURG 15 STRL LF DISP TIS (BLADE) ×2 IMPLANT
BLADE SURG 15 STRL SS (BLADE) ×6
CLEANER CAUTERY TIP 5X5 PAD (MISCELLANEOUS) IMPLANT
CLOSURE STERI-STRIP 1/2X4 (GAUZE/BANDAGES/DRESSINGS)
CLSR STERI-STRIP ANTIMIC 1/2X4 (GAUZE/BANDAGES/DRESSINGS) IMPLANT
DECANTER SPIKE VIAL GLASS SM (MISCELLANEOUS) IMPLANT
DRAPE C-ARM 42X72 X-RAY (DRAPES) IMPLANT
DRAPE IMP U-DRAPE 54X76 (DRAPES) ×3 IMPLANT
DRAPE INCISE IOBAN 66X45 STRL (DRAPES) IMPLANT
DRAPE OEC MINIVIEW 54X84 (DRAPES) ×2 IMPLANT
DRAPE SURG 17X23 STRL (DRAPES) ×3 IMPLANT
DRAPE U-SHAPE 47X51 STRL (DRAPES) ×3 IMPLANT
DRAPE U-SHAPE 76X120 STRL (DRAPES) ×6 IMPLANT
DRILL 2.3 QUICK RELEASE (BIT) ×3
DRILL QUICK RELEASE 2.0MM (INSTRUMENTS) ×3
DRSG MEPILEX BORDER 4X8 (GAUZE/BANDAGES/DRESSINGS) ×3 IMPLANT
DURAPREP 26ML APPLICATOR (WOUND CARE) ×3 IMPLANT
ELECT REM PT RETURN 9FT ADLT (ELECTROSURGICAL) ×3
ELECTRODE REM PT RTRN 9FT ADLT (ELECTROSURGICAL) ×1 IMPLANT
GAUZE SPONGE 4X4 16PLY XRAY LF (GAUZE/BANDAGES/DRESSINGS) IMPLANT
GLOVE BIO SURGEON STRL SZ8 (GLOVE) ×3 IMPLANT
GLOVE BIOGEL PI IND STRL 6.5 (GLOVE) IMPLANT
GLOVE BIOGEL PI IND STRL 7.0 (GLOVE) IMPLANT
GLOVE BIOGEL PI IND STRL 8 (GLOVE) ×2 IMPLANT
GLOVE BIOGEL PI INDICATOR 6.5 (GLOVE) ×2
GLOVE BIOGEL PI INDICATOR 7.0 (GLOVE) ×4
GLOVE BIOGEL PI INDICATOR 8 (GLOVE) ×6
GLOVE ORTHO TXT STRL SZ7.5 (GLOVE) ×6 IMPLANT
GLOVE SURG SS PI 7.0 STRL IVOR (GLOVE) ×4 IMPLANT
GOWN STRL REUS W/ TWL LRG LVL3 (GOWN DISPOSABLE) IMPLANT
GOWN STRL REUS W/ TWL XL LVL3 (GOWN DISPOSABLE) ×2 IMPLANT
GOWN STRL REUS W/TWL LRG LVL3 (GOWN DISPOSABLE)
GOWN STRL REUS W/TWL XL LVL3 (GOWN DISPOSABLE) ×12
NS IRRIG 1000ML POUR BTL (IV SOLUTION) ×3 IMPLANT
PACK ARTHROSCOPY DSU (CUSTOM PROCEDURE TRAY) ×3 IMPLANT
PACK BASIN DAY SURGERY FS (CUSTOM PROCEDURE TRAY) ×3 IMPLANT
PAD CLEANER CAUTERY TIP 5X5 (MISCELLANEOUS)
PENCIL BUTTON HOLSTER BLD 10FT (ELECTRODE) ×3 IMPLANT
PLATE CLAVICAL MED ANTERIOR 8H (Plate) ×2 IMPLANT
SCREW CORTICAL 2.3X14 (Screw) ×4 IMPLANT
SCREW CORTICAL 3.5X20MM (Screw) ×2 IMPLANT
SCREW HEXALOBE NON-LOCK 3.5X14 (Screw) ×2 IMPLANT
SCREW NON LOCK 3.5X10MM (Screw) ×2 IMPLANT
SCREW NON LOCKING HEX 3.5X18MM (Screw) ×2 IMPLANT
SCREW NONLOCK HEX 3.5X12 (Screw) ×4 IMPLANT
SHEET MEDIUM DRAPE 40X70 STRL (DRAPES) ×3 IMPLANT
SLEEVE SCD COMPRESS KNEE MED (MISCELLANEOUS) ×3 IMPLANT
SLING ARM FOAM STRAP LRG (SOFTGOODS) IMPLANT
SLING ARM IMMOBILIZER LRG (SOFTGOODS) IMPLANT
SLING ARM IMMOBILIZER MED (SOFTGOODS) IMPLANT
SLING ARM MED ADULT FOAM STRAP (SOFTGOODS) IMPLANT
SLING ARM XL FOAM STRAP (SOFTGOODS) IMPLANT
SPONGE LAP 4X18 X RAY DECT (DISPOSABLE) ×3 IMPLANT
SUCTION FRAZIER HANDLE 10FR (MISCELLANEOUS)
SUCTION TUBE FRAZIER 10FR DISP (MISCELLANEOUS) IMPLANT
SUT FIBERWIRE #2 38 T-5 BLUE (SUTURE)
SUT MNCRL AB 4-0 PS2 18 (SUTURE) IMPLANT
SUT VIC AB 0 CT1 27 (SUTURE)
SUT VIC AB 0 CT1 27XBRD ANBCTR (SUTURE) IMPLANT
SUT VIC AB 2-0 SH 27 (SUTURE)
SUT VIC AB 2-0 SH 27XBRD (SUTURE) IMPLANT
SUT VICRYL 3-0 CR8 SH (SUTURE) ×3 IMPLANT
SUTURE FIBERWR #2 38 T-5 BLUE (SUTURE) IMPLANT
SYR BULB 3OZ (MISCELLANEOUS) ×3 IMPLANT
TAPE STRIPS DRAPE STRL (GAUZE/BANDAGES/DRESSINGS) IMPLANT
TOWEL OR 17X24 6PK STRL BLUE (TOWEL DISPOSABLE) ×3 IMPLANT
TOWEL OR NON WOVEN STRL DISP B (DISPOSABLE) ×3 IMPLANT
YANKAUER SUCT BULB TIP NO VENT (SUCTIONS) IMPLANT

## 2016-04-18 NOTE — Op Note (Signed)
04/18/2016  5:24 PM  PATIENT:  Alexander Burgess    PRE-OPERATIVE DIAGNOSIS:  LEFT CLAVICLE FRACTURE  POST-OPERATIVE DIAGNOSIS:  Same  PROCEDURE:  OPEN REDUCTION INTERNAL FIXATION (ORIF) CLAVICULAR FRACTURE  SURGEON:  Eulas PostLANDAU,Kamelia Lampkins P, MD  PHYSICIAN ASSISTANT: Janace LittenBrandon Parry, OPA-C, present and scrubbed throughout the case, critical for completion in a timely fashion, and for retraction, instrumentation, and closure.  ANESTHESIA:   General  UNIQUE ASPECTS OF THE CASE:  The fracture was extremely medial, and had a reasonably sized butterfly segment. I placed the plate anteriorly because I could not get superior access medially to be able to provide fixation.  PREOPERATIVE INDICATIONS:  Alexander CaseyGreg Burgess is a  41 y.o. male with a diagnosis of LEFT CLAVICLE FRACTURE who elected for surgical management based on preoperative shortening and angulation and displacement of the fracture.  He has had a previous clavicle fracture, maybe even 2 when he was a child.  The risks benefits and alternatives were discussed with the patient preoperatively including but not limited to the risks of infection, bleeding, nerve injury, malunion, nonunion, hardware failure, the need for hardware removal, recurrent fracture, cardiopulmonary complications, the need for revision surgery, among others, and the patient was willing to proceed.    OPERATIVE IMPLANTS: Acumed 8 hole anterior clavicle plate with a total of 2 interfragmentary lag screws from top to bottom  OPERATIVE FINDINGS: Shortened, displaced clavicle fracture with a significant butterfly piece  OPERATIVE PROCEDURE: The patient was brought to the operating room and placed in the supine position. General anesthesia was administered. IV antibiotics were given. He was placed in the beach chair position. The upper extremity was prepped and draped in the usual sterile fashion. Time out was performed. Incision was made over the clavicle fracture. Dissection was carried  down through the platysma, and the fracture site exposed. The fracture was extremely short.  I ultimately did however achieve satisfactory mobilization, and was able to reduce the fracture anatomically.   I secured the lateral fragment to the butterfly fragment first, using an interfragmentary lag screw, and then placed a second lag screw from the medial segment into the butterfly segment. The medial segment did not have as good of a hold, but it did hold satisfactory to achieve position of the plate.  I secured the plate medially and then laterally, with a total of 3 screws on either side with excellent bicortical fixation. I took great care particularly medially to protect the adjacent neurovascular structures during drilling and placement of the screws.  I had excellent bony apposition and restoration of anatomic alignment of the clavicle. Used C-arm to confirm appropriate alignment, reduction of the fracture, and positioning of the plate and length of the screws.  I then took final C-arm pictures, irrigated the wounds copiously, and repaired the fascia with inverted figure-of-eight Vicryl suture. The subcutaneous tissue was closed with Vicryl as well, and the skin closed with steri-strips, and the patient was awakened and returned to the PACU in stable and satisfactory condition. There were no complications.

## 2016-04-18 NOTE — Anesthesia Procedure Notes (Signed)
Procedure Name: Intubation Date/Time: 04/18/2016 3:17 PM Performed by: Maryella Shivers Pre-anesthesia Checklist: Patient identified, Emergency Drugs available, Suction available and Patient being monitored Patient Re-evaluated:Patient Re-evaluated prior to inductionOxygen Delivery Method: Circle system utilized Preoxygenation: Pre-oxygenation with 100% oxygen Intubation Type: IV induction Ventilation: Mask ventilation without difficulty Laryngoscope Size: Mac and 4 Grade View: Grade I Tube type: Oral Tube size: 8.0 mm Number of attempts: 1 Airway Equipment and Method: Stylet and Oral airway Placement Confirmation: ETT inserted through vocal cords under direct vision,  positive ETCO2 and breath sounds checked- equal and bilateral Secured at: 22 cm Tube secured with: Tape Dental Injury: Teeth and Oropharynx as per pre-operative assessment

## 2016-04-18 NOTE — Discharge Instructions (Signed)
Post Anesthesia Home Care Instructions  Activity: Get plenty of rest for the remainder of the day. A responsible adult should stay with you for 24 hours following the procedure.  For the next 24 hours, DO NOT: -Drive a car -Advertising copywriterperate machinery -Drink alcoholic beverages -Take any medication unless instructed by your physician -Make any legal decisions or sign important papers.  Meals: Start with liquid foods such as gelatin or soup. Progress to regular foods as tolerated. Avoid greasy, spicy, heavy foods. If nausea and/or vomiting occur, drink only clear liquids until the nausea and/or vomiting subsides. Call your physician if vomiting continues.  Special Instructions/Symptoms: Your throat may feel dry or sore from the anesthesia or the breathing tube placed in your throat during surgery. If this causes discomfort, gargle with warm salt water. The discomfort should disappear within 24 hours.  If you had a scopolamine patch placed behind your ear for the management of post- operative nausea and/or vomiting:  1. The medication in the patch is effective for 72 hours, after which it should be removed.  Wrap patch in a tissue and discard in the trash. Wash hands thoroughly with soap and water. 2. You may remove the patch earlier than 72 hours if you experience unpleasant side effects which may include dry mouth, dizziness or visual disturbances. 3. Avoid touching the patch. Wash your hands with soap and water after contact with the patch.       Diet: As you were doing prior to hospitalization   Shower:  May shower but keep the wounds dry, use an occlusive plastic wrap, NO SOAKING IN TUB.  If the bandage gets wet, change with a clean dry gauze.  If you have a splint on, leave the splint in place and keep the splint dry with a plastic bag.  Dressing:  You may change your dressing 3-5 days after surgery, unless you have a splint.  If you have a splint, then just leave the splint in place and  we will change your bandages during your first follow-up appointment.    If you had hand or foot surgery, we will plan to remove your stitches in about 2 weeks in the office.  For all other surgeries, there are sticky tapes (steri-strips) on your wounds and all the stitches are absorbable.  Leave the steri-strips in place when changing your dressings, they will peel off with time, usually 2-3 weeks.  Activity:  Increase activity slowly as tolerated, but follow the weight bearing instructions below.  The rules on driving is that you can not be taking narcotics while you drive, and you must feel in control of the vehicle.    Weight Bearing:   Sling at all times..    To prevent constipation: you may use a stool softener such as -  Colace (over the counter) 100 mg by mouth twice a day  Drink plenty of fluids (prune juice may be helpful) and high fiber foods Miralax (over the counter) for constipation as needed.    Itching:  If you experience itching with your medications, try taking only a single pain pill, or even half a pain pill at a time.  You may take up to 10 pain pills per day, and you can also use benadryl over the counter for itching or also to help with sleep.   Precautions:  If you experience chest pain or shortness of breath - call 911 immediately for transfer to the hospital emergency department!!  If you develop a fever greater  that 101 F, purulent drainage from wound, increased redness or drainage from wound, or calf pain -- Call the office at [phone]                                                Follow- Up Appointment:  Please call for an appointment to be seen in 2 weeks [place] - [phone]

## 2016-04-18 NOTE — Anesthesia Preprocedure Evaluation (Addendum)
Anesthesia Evaluation  Patient identified by MRN, date of birth, ID band Patient awake    Reviewed: Allergy & Precautions, NPO status , Patient's Chart, lab work & pertinent test results  Airway Mallampati: II  TM Distance: >3 FB Neck ROM: Full    Dental  (+) Teeth Intact, Dental Advisory Given   Pulmonary neg pulmonary ROS,    breath sounds clear to auscultation       Cardiovascular negative cardio ROS   Rhythm:Regular Rate:Normal     Neuro/Psych negative neurological ROS  negative psych ROS   GI/Hepatic Neg liver ROS, GERD  ,  Endo/Other  negative endocrine ROS  Renal/GU negative Renal ROS  negative genitourinary   Musculoskeletal negative musculoskeletal ROS (+)   Abdominal   Peds negative pediatric ROS (+)  Hematology negative hematology ROS (+)   Anesthesia Other Findings   Reproductive/Obstetrics negative OB ROS                            Anesthesia Physical Anesthesia Plan  ASA: II  Anesthesia Plan: General   Post-op Pain Management:    Induction: Intravenous  Airway Management Planned: Oral ETT  Additional Equipment:   Intra-op Plan:   Post-operative Plan: Extubation in OR  Informed Consent: I have reviewed the patients History and Physical, chart, labs and discussed the procedure including the risks, benefits and alternatives for the proposed anesthesia with the patient or authorized representative who has indicated his/her understanding and acceptance.   Dental advisory given  Plan Discussed with: CRNA  Anesthesia Plan Comments:         Anesthesia Quick Evaluation  

## 2016-04-18 NOTE — H&P (Signed)
PREOPERATIVE H&Burgess  Chief Complaint: LEFT CLAVICLE FRACTURE  HPI: Alexander Burgess is a 41 y.o. male who presents for preoperative history and physical with a diagnosis of LEFT CLAVICLE FRACTURE. Symptoms are rated as moderate to severe, and have been worsening.  This is significantly impairing activities of daily living.  He has elected for surgical management.   Pt states he was playing baseball with some kids at a park when he was running and tripped, thus landing on his left shoulder. Pt states he was trying to avoid landing on a child.   DOI 2/18.  Past Medical History:  Diagnosis Date  . Closed left clavicular fracture   . GERD (gastroesophageal reflux disease)    Past Surgical History:  Procedure Laterality Date  . COLONOSCOPY    . VASECTOMY     Social History   Social History  . Marital status: Married    Spouse name: N/A  . Number of children: N/A  . Years of education: N/A   Social History Main Topics  . Smoking status: Never Smoker  . Smokeless tobacco: Never Used  . Alcohol use Yes     Comment: social  . Drug use: No  . Sexual activity: Yes   Other Topics Concern  . None   Social History Narrative  . None   History reviewed. No pertinent family history. No Known Allergies Prior to Admission medications   Medication Sig Start Date End Date Taking? Authorizing Provider  diclofenac (VOLTAREN) 50 MG EC tablet Take 1 tablet (50 mg total) by mouth 2 (two) times daily. 04/16/16  Yes Hope Orlene OchM Neese, NP  oxyCODONE-acetaminophen (PERCOCET/ROXICET) 5-325 MG tablet Take 1-2 tablets by mouth every 4 (four) hours as needed for severe pain. 04/16/16  Yes Hope Orlene OchM Neese, NP     Positive ROS: All other systems have been reviewed and were otherwise negative with the exception of those mentioned in the HPI and as above.  Physical Exam: General: Alert, no acute distress Cardiovascular: No pedal edema Respiratory: No cyanosis, no use of accessory musculature GI: No organomegaly,  abdomen is soft and non-tender Skin: No lesions in the area of chief complaint Neurologic: Sensation intact distally Psychiatric: Patient is competent for consent with normal mood and affect Lymphatic: No axillary or cervical lymphadenopathy  MUSCULOSKELETAL: left shoulder girdle is shortened and has crepitance and pain to palpation over left clavicle  Assessment: LEFT CLAVICLE FRACTURE   Plan: Plan for Procedure(s): OPEN REDUCTION INTERNAL FIXATION (ORIF) CLAVICULAR FRACTURE  The risks benefits and alternatives were discussed with the patient including but not limited to the risks of nonoperative treatment, versus surgical intervention including infection, bleeding, nerve injury, malunion, nonunion, the need for revision surgery, hardware prominence, hardware failure, the need for hardware removal, blood clots, cardiopulmonary complications, morbidity, mortality, among others, and they were willing to proceed.     Alexander Burgess,Alexander Krone P, MD Cell [phone]   04/18/2016 2:21 PM

## 2016-04-18 NOTE — Transfer of Care (Signed)
Immediate Anesthesia Transfer of Care Note  Patient: Alexander Burgess  Procedure(s) Performed: Procedure(s): OPEN REDUCTION INTERNAL FIXATION (ORIF) CLAVICULAR FRACTURE (Left)  Patient Location: PACU  Anesthesia Type:General  Level of Consciousness: sedated  Airway & Oxygen Therapy: Patient Spontanous Breathing and Patient connected to face mask oxygen  Post-op Assessment: Report given to RN and Post -op Vital signs reviewed and stable  Post vital signs: Reviewed and stable  Last Vitals:  Vitals:   04/18/16 1152 04/18/16 1724  BP: 129/80   Pulse: 68 68  Resp: 18 (!) 9  Temp: 36.9 C (P) 36.7 C    Last Pain:  Vitals:   04/18/16 1152  TempSrc: Oral  PainSc: 3       Patients Stated Pain Goal: 1 (04/18/16 1152)  Complications: No apparent anesthesia complications

## 2016-04-18 NOTE — Anesthesia Postprocedure Evaluation (Addendum)
Anesthesia Post Note  Patient: Alexander CaseyGreg Linan  Procedure(s) Performed: Procedure(s) (LRB): OPEN REDUCTION INTERNAL FIXATION (ORIF) CLAVICULAR FRACTURE (Left)  Patient location during evaluation: PACU Anesthesia Type: General Level of consciousness: awake and alert Pain management: pain level controlled Vital Signs Assessment: post-procedure vital signs reviewed and stable Respiratory status: spontaneous breathing, nonlabored ventilation, respiratory function stable and patient connected to nasal cannula oxygen Cardiovascular status: blood pressure returned to baseline and stable Postop Assessment: no signs of nausea or vomiting Anesthetic complications: no       Last Vitals:  Vitals:   04/18/16 1745 04/18/16 1800  BP: 128/77 137/89  Pulse: 68 78  Resp: 10 18  Temp:      Last Pain:  Vitals:   04/18/16 1800  TempSrc:   PainSc: 0-No pain                 Shelton SilvasKevin D Neno Hohensee

## 2016-04-19 ENCOUNTER — Encounter (HOSPITAL_BASED_OUTPATIENT_CLINIC_OR_DEPARTMENT_OTHER): Payer: Self-pay | Admitting: Orthopedic Surgery

## 2016-07-28 NOTE — Addendum Note (Signed)
Addendum  created 07/28/16 1029 by Lasandra Batley D, MD   Sign clinical note    

## 2017-08-08 IMAGING — DX DG CLAVICLE*L*
2 series · 2 of 2 positions shown · non-contrast
Comparison: None.

CLINICAL DATA: Status post fall onto left shoulder while playing
baseball, with left clavicular pain. Initial encounter.

EXAM:
LEFT CLAVICLE - 2+ VIEWS

[clavicle ap]
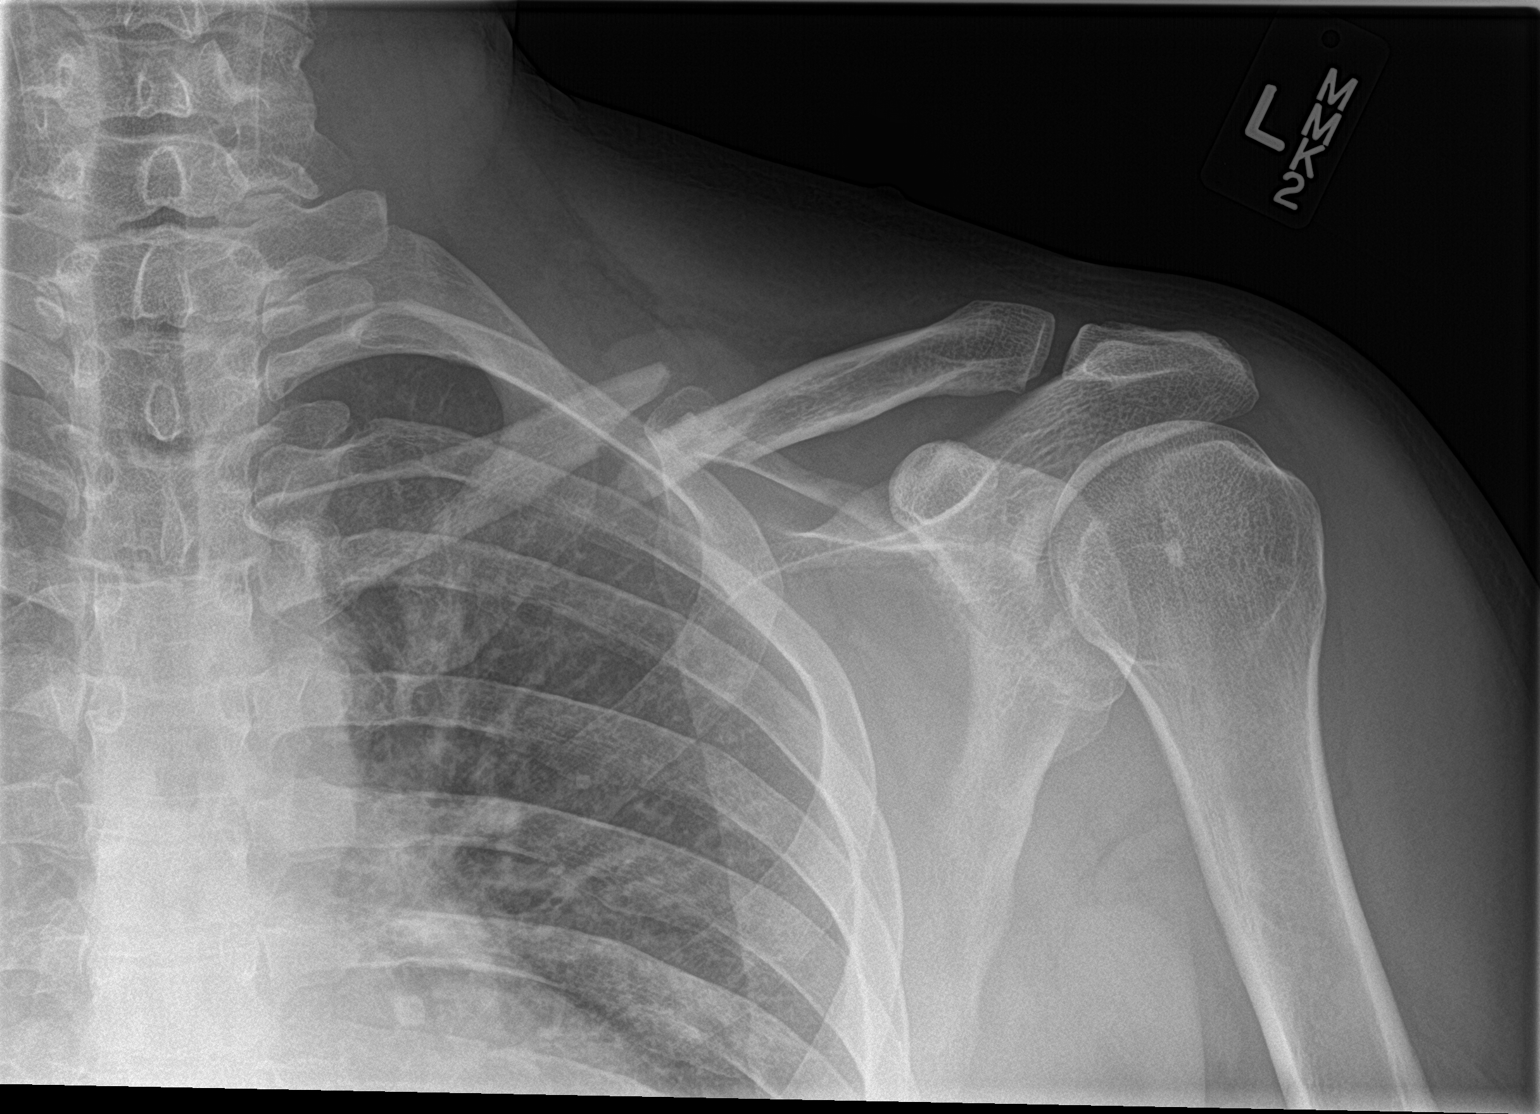

[clavicle axial]
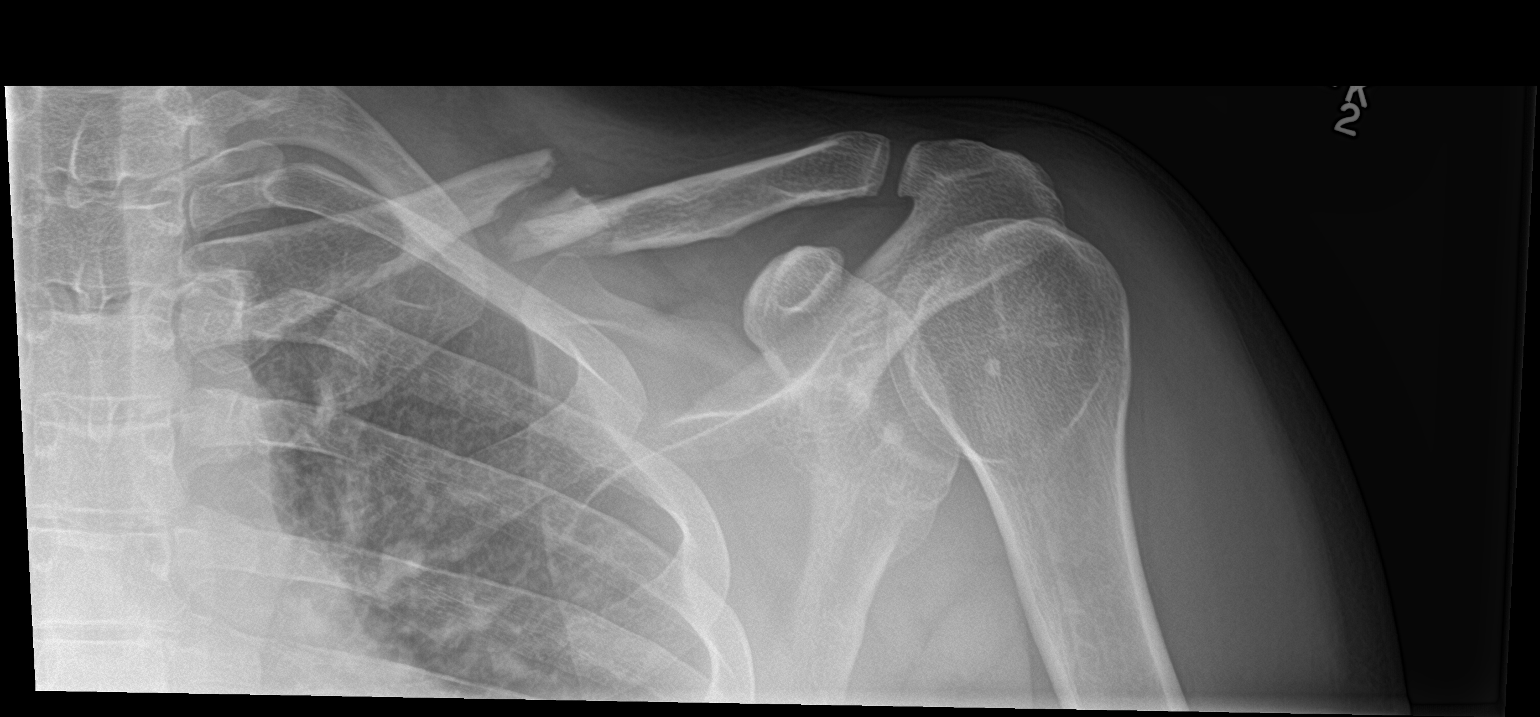

[2 of 2 positions shown; findings below may reference images not displayed]

FINDINGS: There is a mildly comminuted fracture of the middle third of the
left clavicle, with approximately 1 shaft width inferior
displacement of the distal clavicle.

No additional fractures are seen. The left acromioclavicular joint
is unremarkable. The left humeral head remains seated at the glenoid
fossa. Mild soft tissue swelling is noted at the site of fracture.
The visualized portions of lungs are grossly clear.
IMPRESSION: Mildly comminuted fracture of the middle third of the left clavicle,
with approximately 1 shaft width inferior displacement of the distal
clavicle.

## 2018-04-28 ENCOUNTER — Other Ambulatory Visit: Payer: Self-pay

## 2018-04-28 ENCOUNTER — Encounter (HOSPITAL_BASED_OUTPATIENT_CLINIC_OR_DEPARTMENT_OTHER): Payer: Self-pay | Admitting: Emergency Medicine

## 2018-04-28 ENCOUNTER — Emergency Department (HOSPITAL_BASED_OUTPATIENT_CLINIC_OR_DEPARTMENT_OTHER)
Admission: EM | Admit: 2018-04-28 | Discharge: 2018-04-28 | Disposition: A | Discharge status: Home / Self Care | Payer: Federal, State, Local not specified - PPO | Attending: Emergency Medicine | Admitting: Emergency Medicine

## 2018-04-28 DIAGNOSIS — J111 Influenza due to unidentified influenza virus with other respiratory manifestations: Secondary | ICD-10-CM | POA: Diagnosis not present

## 2018-04-28 DIAGNOSIS — Z79899 Other long term (current) drug therapy: Secondary | ICD-10-CM | POA: Diagnosis not present

## 2018-04-28 DIAGNOSIS — R69 Illness, unspecified: Secondary | ICD-10-CM

## 2018-04-28 DIAGNOSIS — R05 Cough: Secondary | ICD-10-CM | POA: Diagnosis present

## 2018-04-28 MED ORDER — ONDANSETRON 8 MG PO TBDP
8.0000 mg | ORAL_TABLET | Freq: Once | ORAL | Status: AC
  Administered 2018-04-28: 8 mg via ORAL
  Filled 2018-04-28: qty 1

## 2018-04-28 MED ORDER — BENZONATATE 100 MG PO CAPS: 100.0000 mg | ORAL_CAPSULE | Freq: Three times a day (TID) | ORAL | 0 refills | Status: AC | PRN

## 2018-04-28 MED ORDER — IBUPROFEN 400 MG PO TABS
600.0000 mg | ORAL_TABLET | Freq: Once | ORAL | Status: AC
  Administered 2018-04-28: 13:00:00 600 mg via ORAL
  Filled 2018-04-28: qty 1

## 2018-04-28 MED ORDER — ONDANSETRON 4 MG PO TBDP: 4.0000 mg | ORAL_TABLET | Freq: Three times a day (TID) | ORAL | 0 refills | Status: AC | PRN

## 2018-04-28 NOTE — ED Notes (Signed)
Pt verbalized understanding to pick up Rx at pharmacy listed on paperwork. Work note given

## 2018-04-28 NOTE — ED Provider Notes (Signed)
[facility] EMERGENCY DEPARTMENT Provider Note   CSN: [id number] Arrival date & time: 04/28/18  1116    History   Chief Complaint Chief Complaint  Patient presents with  . Cough    HPI Alexander Burgess is a 43 y.o. male arrives healthy male presenting to emergency department today with chief complaint of flulike symptoms x5 days.  Patient reports he returned last night from a 7-day cruise to the [place] and on day 4 of the cruise started feeling generalized body aches cough, chills and otalgia.  Patient reports subjective fever and chills.  He has had a nonproductive cough with associated nausea and vomiting. He has taken Tylenol for his symptoms with minimal relief.  He denies any sick contacts.  No acute influenza vaccine this year.  He denies any chest pain, shortness of breath, urinary symptoms, diarrhea. History provided by patient       Past Medical History:  Diagnosis Date  . Closed displaced fracture of left clavicle 04/18/2016  . Closed left clavicular fracture   . GERD (gastroesophageal reflux disease)     Patient Active Problem List   Diagnosis Date Noted  . Closed displaced fracture of left clavicle 04/18/2016    Past Surgical History:  Procedure Laterality Date  . COLONOSCOPY    . ORIF CLAVICULAR FRACTURE Left 04/18/2016   Procedure: OPEN REDUCTION INTERNAL FIXATION (ORIF) CLAVICULAR FRACTURE;  Surgeon: Teryl Lucy, MD;  Location: [facility];  Service: Orthopedics;  Laterality: Left;  Marland Kitchen VASECTOMY          Home Medications    Prior to Admission medications   Medication Sig Start Date End Date Taking? Authorizing Provider  baclofen (LIORESAL) 10 MG tablet Take 1 tablet (10 mg total) by mouth 3 (three) times daily. As needed for muscle spasm 04/18/16   Teryl Lucy, MD  benzonatate (TESSALON) 100 MG capsule Take 1 capsule (100 mg total) by mouth 3 (three) times daily as needed for cough. 04/28/18   Albrizze, Kaitlyn E, PA-C    ondansetron (ZOFRAN ODT) 4 MG disintegrating tablet Take 1 tablet (4 mg total) by mouth every 8 (eight) hours as needed for nausea or vomiting. 04/28/18   Albrizze, Kaitlyn E, PA-C  ondansetron (ZOFRAN) 4 MG tablet Take 1 tablet (4 mg total) by mouth every 8 (eight) hours as needed for nausea or vomiting. 04/18/16   Teryl Lucy, MD  oxyCODONE-acetaminophen (PERCOCET) 10-325 MG tablet Take 1-2 tablets by mouth every 6 (six) hours as needed for pain. MAXIMUM TOTAL ACETAMINOPHEN DOSE IS 4000 MG PER DAY 04/18/16   Teryl Lucy, MD  sennosides-docusate sodium (SENOKOT-S) 8.6-50 MG tablet Take 2 tablets by mouth daily. 04/18/16   Teryl Lucy, MD    Family History History reviewed. No pertinent family history.  Social History Social History   Tobacco Use  . Smoking status: Never Smoker  . Smokeless tobacco: Never Used  Substance Use Topics  . Alcohol use: Yes    Comment: social  . Drug use: No     Allergies   Patient has no known allergies.   Review of Systems Review of Systems  Constitutional: Positive for chills and fever.  HENT: Positive for congestion, ear pain and sore throat.   Respiratory: Positive for cough.   Gastrointestinal: Positive for nausea and vomiting. Negative for abdominal pain, blood in stool and diarrhea.  All other systems reviewed and are negative.    Physical Exam Updated Vital Signs BP 129/77 (BP Location: Left Arm)   Pulse 81  Temp 99 F (37.2 C) (Oral)   Resp 18   Ht 6\' 4"  (1.93 m)   Wt 97.5 kg   SpO2 98%   BMI 26.17 kg/m   Physical Exam Vitals signs and nursing note reviewed.  Constitutional:      Appearance: He is well-developed. He is not toxic-appearing.  HENT:     Head: Normocephalic and atraumatic.     Right Ear: Tympanic membrane and external ear normal.     Left Ear: Tympanic membrane and external ear normal.     Nose: Congestion present.     Mouth/Throat:     Mouth: Mucous membranes are moist.     Pharynx: Oropharynx is  clear.     Comments: Minor erythema to oropharynx, no edema, no exudate, no tonsillar swelling, voice normal, neck supple without lymphadenopathy  Eyes:     General: No scleral icterus.       Right eye: No discharge.        Left eye: No discharge.     Conjunctiva/sclera: Conjunctivae normal.  Neck:     Musculoskeletal: Normal range of motion. No muscular tenderness.  Cardiovascular:     Rate and Rhythm: Normal rate and regular rhythm.     Pulses: Normal pulses.     Heart sounds: Normal heart sounds.  Pulmonary:     Effort: Pulmonary effort is normal.     Breath sounds: Normal breath sounds.     Comments: Lung sounds clear to auscultation throughout. Abdominal:     General: There is no distension.     Palpations: Abdomen is soft.     Comments: No peritoneal signs.  Musculoskeletal: Normal range of motion.  Skin:    General: Skin is warm and dry.     Capillary Refill: Capillary refill takes less than 2 seconds.     Findings: No rash.  Neurological:     Mental Status: He is oriented to person, place, and time.     Comments: Fluent speech, no facial droop.  Psychiatric:        Behavior: Behavior normal.      ED Treatments / Results  Labs (all labs ordered are listed, but only abnormal results are displayed) Labs Reviewed - No data to display  EKG None  Radiology No results found.  Procedures Procedures (including critical care time)  Medications Ordered in ED Medications  ibuprofen (ADVIL,MOTRIN) tablet 600 mg (600 mg Oral Given 04/28/18 1300)  ondansetron (ZOFRAN-ODT) disintegrating tablet 8 mg (8 mg Oral Given 04/28/18 1301)     Initial Impression / Assessment and Plan / ED Course  I have reviewed the triage vital signs and the nursing notes.  Pertinent labs & imaging results that were available during my care of the patient were reviewed by me and considered in my medical decision making (see chart for details).    Patient with symptoms consistent with  influenza.  Vitals are stable, low-grade fever.  No signs of dehydration, tolerating PO's.  Lungs are clear. Due to patient's presentation and physical exam a chest x-ray was not ordered bc likely diagnosis of flu.  Discussed the risk versus benefit of Tamiflu treatment with the patient.  The patient understands that symptoms are greater than the recommended 24-48 hour window of treatment.  Patient will be discharged with instructions to orally hydrate, rest, and use over-the-counter medications such as anti-inflammatories ibuprofen and Aleve for muscle aches and Tylenol for fever.  Patient will also be given a cough suppressant.   Patient is  hemodynamically stable, in NAD, and able to ambulate in the ED.  Patient is comfortable with above plan and is stable for discharge at this time. All questions were answered prior to disposition. Strict return precautions for returning to the ED were discussed. Encouraged follow up with PCP.         Final Clinical Impressions(s) / ED Diagnoses   Final diagnoses:  Influenza-like illness    ED Discharge Orders         Ordered    ondansetron (ZOFRAN ODT) 4 MG disintegrating tablet  Every 8 hours PRN     04/28/18 1249    benzonatate (TESSALON) 100 MG capsule  3 times daily PRN     04/28/18 1249           Sherene Sireslbrizze, Kaitlyn E, PA-C 04/28/18 1326    Raeford RazorKohut, Stephen, MD 05/01/18 [phone]

## 2018-04-28 NOTE — ED Triage Notes (Signed)
Reports productive cough, chills, and otalgia x 5 days.

## 2018-04-28 NOTE — Discharge Instructions (Addendum)
You have the flu this is a viral infection that will likely start to improve after 5-7 days, antibiotics are not helpful in treating viral infections.  Since your symptoms have been present for more than 2 days Tamiflu will give no additional benefit.  Please make sure you are drinking plenty of fluids to avoid dehydration. You can treat your symptoms supportively with tylenol/ibuprofen for fevers and pains, Zyrtec and Flonase to heal with nasal congestion, and throat lozenges to help with cough. If your symptoms are not improving please follow up with you Primary doctor.   Prescription has been sent to your pharmacy for Zofran.  This is a medication you can take for nausea, please take as prescribed.  Also prescription has been sent for [facility].  This is a cough medicine you can also take as needed, again please take as prescribed.  If you develop persistent fevers, shortness of breath or difficulty breathing, chest pain, severe headache and neck pain, persistent nausea and vomiting or other new or concerning symptoms return to the Emergency department.

## 2018-04-28 NOTE — ED Notes (Signed)
Pt given water for fluid challenge
